# Patient Record
Sex: Female | Born: 1950 | Race: White | Hispanic: No | Marital: Married | Smoking: Never smoker
Health system: Southern US, Community
[De-identification: ages and names within clinical notes are randomized; demographics above are authoritative.]

## PROBLEM LIST (undated history)

## (undated) DIAGNOSIS — M1990 Unspecified osteoarthritis, unspecified site: Secondary | ICD-10-CM

## (undated) HISTORY — PX: APPENDECTOMY: SHX54

---

## 2001-07-02 HISTORY — PX: BREAST CYST ASPIRATION: SHX578

## 2004-07-25 ENCOUNTER — Ambulatory Visit: Payer: Self-pay | Admitting: Family Medicine

## 2005-10-18 ENCOUNTER — Ambulatory Visit: Payer: Self-pay | Admitting: Family Medicine

## 2006-10-24 ENCOUNTER — Ambulatory Visit: Payer: Self-pay | Admitting: Family Medicine

## 2007-12-08 ENCOUNTER — Ambulatory Visit: Payer: Self-pay

## 2008-01-05 ENCOUNTER — Ambulatory Visit: Payer: Self-pay | Admitting: Gastroenterology

## 2009-01-13 ENCOUNTER — Ambulatory Visit: Payer: Self-pay

## 2009-07-02 HISTORY — PX: PARTIAL KNEE ARTHROPLASTY: SHX2174

## 2010-03-09 ENCOUNTER — Ambulatory Visit: Payer: Self-pay | Admitting: Nurse Practitioner

## 2011-03-13 ENCOUNTER — Ambulatory Visit: Payer: Self-pay | Admitting: Nurse Practitioner

## 2012-05-06 ENCOUNTER — Ambulatory Visit: Payer: Self-pay | Admitting: Nurse Practitioner

## 2013-06-16 ENCOUNTER — Ambulatory Visit: Payer: Self-pay | Admitting: Nurse Practitioner

## 2015-07-06 ENCOUNTER — Other Ambulatory Visit: Payer: Self-pay | Admitting: Family Medicine

## 2015-07-06 DIAGNOSIS — Z1239 Encounter for other screening for malignant neoplasm of breast: Secondary | ICD-10-CM

## 2015-07-21 ENCOUNTER — Ambulatory Visit
Admission: RE | Admit: 2015-07-21 | Discharge: 2015-07-21 | Disposition: A | Discharge status: Home / Self Care | Payer: BLUE CROSS/BLUE SHIELD | Source: Ambulatory Visit | Attending: Family Medicine | Admitting: Family Medicine

## 2015-07-21 DIAGNOSIS — Z1239 Encounter for other screening for malignant neoplasm of breast: Secondary | ICD-10-CM

## 2015-07-21 DIAGNOSIS — Z1231 Encounter for screening mammogram for malignant neoplasm of breast: Secondary | ICD-10-CM | POA: Insufficient documentation

## 2016-08-29 ENCOUNTER — Other Ambulatory Visit: Payer: Self-pay | Admitting: Family Medicine

## 2016-08-29 DIAGNOSIS — Z1239 Encounter for other screening for malignant neoplasm of breast: Secondary | ICD-10-CM

## 2016-09-04 ENCOUNTER — Ambulatory Visit
Admission: RE | Admit: 2016-09-04 | Discharge: 2016-09-04 | Disposition: A | Discharge status: Home / Self Care | Payer: BLUE CROSS/BLUE SHIELD | Source: Ambulatory Visit | Attending: Family Medicine | Admitting: Family Medicine

## 2016-09-04 DIAGNOSIS — Z1231 Encounter for screening mammogram for malignant neoplasm of breast: Secondary | ICD-10-CM | POA: Insufficient documentation

## 2016-09-04 DIAGNOSIS — Z1239 Encounter for other screening for malignant neoplasm of breast: Secondary | ICD-10-CM

## 2017-10-11 ENCOUNTER — Other Ambulatory Visit: Payer: Self-pay

## 2017-10-11 ENCOUNTER — Telehealth: Payer: Self-pay

## 2017-10-11 DIAGNOSIS — Z1211 Encounter for screening for malignant neoplasm of colon: Secondary | ICD-10-CM

## 2017-10-11 NOTE — Telephone Encounter (Signed)
Gastroenterology Pre-Procedure Review  Request Date:  Requesting Physician: Dr.   PATIENT REVIEW QUESTIONS: The patient responded to the following health history questions as indicated:    1. Are you having any GI issues? no 2. Do you have a personal history of Polyps? no 3. Do you have a family history of Colon Cancer or Polyps? no 4. Diabetes Mellitus? no 5. Joint replacements in the past 12 months?no 6. Major health problems in the past 3 months?no 7. Any artificial heart valves, MVP, or defibrillator?no    MEDICATIONS & ALLERGIES:    Patient reports the following regarding taking any anticoagulation/antiplatelet therapy:   Plavix, Coumadin, Eliquis, Xarelto, Lovenox, Pradaxa, Brilinta, or Effient? no Aspirin? no  Patient confirms/reports the following medications:  No current outpatient medications on file.   No current facility-administered medications for this visit.     Patient confirms/reports the following allergies:  Allergies not on file  No orders of the defined types were placed in this encounter.   AUTHORIZATION INFORMATION Primary Insurance: 1D#: Group #:  Secondary Insurance: 1D#: Group #:  SCHEDULE INFORMATION: Date:  Time: Location:

## 2017-10-14 ENCOUNTER — Encounter: Payer: Self-pay | Admitting: *Deleted

## 2017-10-14 ENCOUNTER — Other Ambulatory Visit: Payer: Self-pay

## 2017-10-14 ENCOUNTER — Other Ambulatory Visit: Payer: Self-pay | Admitting: Family Medicine

## 2017-10-14 DIAGNOSIS — Z1231 Encounter for screening mammogram for malignant neoplasm of breast: Secondary | ICD-10-CM

## 2017-10-15 ENCOUNTER — Other Ambulatory Visit: Payer: Self-pay

## 2017-10-15 ENCOUNTER — Ambulatory Visit
Admission: RE | Admit: 2017-10-15 | Discharge: 2017-10-15 | Disposition: A | Discharge status: Home / Self Care | Payer: BLUE CROSS/BLUE SHIELD | Source: Ambulatory Visit | Attending: Family Medicine | Admitting: Family Medicine

## 2017-10-15 DIAGNOSIS — Z1231 Encounter for screening mammogram for malignant neoplasm of breast: Secondary | ICD-10-CM | POA: Insufficient documentation

## 2017-10-15 MED ORDER — PEG 3350-KCL-NABCB-NACL-NASULF 236 G PO SOLR: ORAL | 0 refills | Status: AC

## 2017-10-16 NOTE — Discharge Instructions (Signed)
General Anesthesia, Adult, Care After °These instructions provide you with information about caring for yourself after your procedure. Your health care provider may also give you more specific instructions. Your treatment has been planned according to current medical practices, but problems sometimes occur. Call your health care provider if you have any problems or questions after your procedure. °What can I expect after the procedure? °After the procedure, it is common to have: °· Vomiting. °· A sore throat. °· Mental slowness. ° °It is common to feel: °· Nauseous. °· Cold or shivery. °· Sleepy. °· Tired. °· Sore or achy, even in parts of your body where you did not have surgery. ° °Follow these instructions at home: °For at least 24 hours after the procedure: °· Do not: °? Participate in activities where you could fall or become injured. °? Drive. °? Use heavy machinery. °? Drink alcohol. °? Take sleeping pills or medicines that cause drowsiness. °? Make important decisions or sign legal documents. °? Take care of children on your own. °· Rest. °Eating and drinking °· If you vomit, drink water, juice, or soup when you can drink without vomiting. °· Drink enough fluid to keep your urine clear or pale yellow. °· Make sure you have little or no nausea before eating solid foods. °· Follow the diet recommended by your health care provider. °General instructions °· Have a responsible adult stay with you until you are awake and alert. °· Return to your normal activities as told by your health care provider. Ask your health care provider what activities are safe for you. °· Take over-the-counter and prescription medicines only as told by your health care provider. °· If you smoke, do not smoke without supervision. °· Keep all follow-up visits as told by your health care provider. This is important. °Contact a health care provider if: °· You continue to have nausea or vomiting at home, and medicines are not helpful. °· You  cannot drink fluids or start eating again. °· You cannot urinate after 8-12 hours. °· You develop a skin rash. °· You have fever. °· You have increasing redness at the site of your procedure. °Get help right away if: °· You have difficulty breathing. °· You have chest pain. °· You have unexpected bleeding. °· You feel that you are having a life-threatening or urgent problem. °This information is not intended to replace advice given to you by your health care provider. Make sure you discuss any questions you have with your health care provider. °Document Released: 09/24/2000 Document Revised: 11/21/2015 Document Reviewed: 06/02/2015 °Elsevier Interactive Patient Education © 2018 [employer] ° °

## 2017-10-17 ENCOUNTER — Ambulatory Visit: Payer: BLUE CROSS/BLUE SHIELD | Admitting: Anesthesiology

## 2017-10-17 ENCOUNTER — Ambulatory Visit
Admission: RE | Admit: 2017-10-17 | Discharge: 2017-10-17 | Disposition: A | Discharge status: Home / Self Care | Payer: BLUE CROSS/BLUE SHIELD | Source: Ambulatory Visit | Attending: Gastroenterology | Admitting: Gastroenterology

## 2017-10-17 ENCOUNTER — Encounter
Admission: RE | Disposition: A | Discharge status: Home / Self Care | Payer: Self-pay | Source: Ambulatory Visit | Attending: Gastroenterology

## 2017-10-17 DIAGNOSIS — Z1211 Encounter for screening for malignant neoplasm of colon: Secondary | ICD-10-CM | POA: Diagnosis not present

## 2017-10-17 DIAGNOSIS — Z96652 Presence of left artificial knee joint: Secondary | ICD-10-CM | POA: Diagnosis not present

## 2017-10-17 DIAGNOSIS — Z79899 Other long term (current) drug therapy: Secondary | ICD-10-CM | POA: Diagnosis not present

## 2017-10-17 DIAGNOSIS — D123 Benign neoplasm of transverse colon: Secondary | ICD-10-CM | POA: Diagnosis not present

## 2017-10-17 DIAGNOSIS — K640 First degree hemorrhoids: Secondary | ICD-10-CM | POA: Insufficient documentation

## 2017-10-17 HISTORY — PX: POLYPECTOMY: SHX5525

## 2017-10-17 HISTORY — DX: Unspecified osteoarthritis, unspecified site: M19.90

## 2017-10-17 HISTORY — PX: COLONOSCOPY WITH PROPOFOL: SHX5780

## 2017-10-17 SURGERY — COLONOSCOPY WITH PROPOFOL
Anesthesia: General | Wound class: Contaminated

## 2017-10-17 MED ORDER — LACTATED RINGERS IV SOLN
INTRAVENOUS | Status: DC
  Administered 2017-10-17: 08:00:00 via INTRAVENOUS

## 2017-10-17 MED ORDER — LIDOCAINE HCL (CARDIAC) PF 100 MG/5ML IV SOSY
PREFILLED_SYRINGE | INTRAVENOUS | Status: DC | PRN
  Administered 2017-10-17: 30 mg via INTRAVENOUS

## 2017-10-17 MED ORDER — ACETAMINOPHEN 160 MG/5ML PO SOLN: 325.0000 mg | ORAL | Status: DC | PRN

## 2017-10-17 MED ORDER — LACTATED RINGERS IV SOLN: INTRAVENOUS | Status: DC

## 2017-10-17 MED ORDER — ONDANSETRON HCL 4 MG/2ML IJ SOLN: 4.0000 mg | Freq: Once | INTRAMUSCULAR | Status: DC | PRN

## 2017-10-17 MED ORDER — ACETAMINOPHEN 325 MG PO TABS: 650.0000 mg | ORAL_TABLET | Freq: Once | ORAL | Status: DC | PRN

## 2017-10-17 MED ORDER — PROPOFOL 10 MG/ML IV BOLUS
INTRAVENOUS | Status: DC | PRN
  Administered 2017-10-17: 30 mg via INTRAVENOUS
  Administered 2017-10-17: 40 mg via INTRAVENOUS
  Administered 2017-10-17: 30 mg via INTRAVENOUS
  Administered 2017-10-17: 150 mg via INTRAVENOUS
  Administered 2017-10-17: 30 mg via INTRAVENOUS

## 2017-10-17 SURGICAL SUPPLY — 24 items
CANISTER SUCT 1200ML W/VALVE (MISCELLANEOUS) ×4 IMPLANT
CLIP HMST 235XBRD CATH ROT (MISCELLANEOUS) IMPLANT
CLIP RESOLUTION 360 11X235 (MISCELLANEOUS)
ELECT REM PT RETURN 9FT ADLT (ELECTROSURGICAL)
ELECTRODE REM PT RTRN 9FT ADLT (ELECTROSURGICAL) IMPLANT
FCP ESCP3.2XJMB 240X2.8X (MISCELLANEOUS)
FORCEPS BIOP RAD 4 LRG CAP 4 (CUTTING FORCEPS) ×4 IMPLANT
FORCEPS BIOP RJ4 240 W/NDL (MISCELLANEOUS)
FORCEPS ESCP3.2XJMB 240X2.8X (MISCELLANEOUS) IMPLANT
GOWN CVR UNV OPN BCK APRN NK (MISCELLANEOUS) ×4 IMPLANT
GOWN ISOL THUMB LOOP REG UNIV (MISCELLANEOUS) ×4
INJECTOR VARIJECT VIN23 (MISCELLANEOUS) IMPLANT
KIT DEFENDO VALVE AND CONN (KITS) IMPLANT
KIT ENDO PROCEDURE OLY (KITS) ×4 IMPLANT
MARKER SPOT ENDO TATTOO 5ML (MISCELLANEOUS) IMPLANT
PROBE APC STR FIRE (PROBE) IMPLANT
RETRIEVER NET ROTH 2.5X230 LF (MISCELLANEOUS) IMPLANT
SNARE SHORT THROW 13M SML OVAL (MISCELLANEOUS) IMPLANT
SNARE SHORT THROW 30M LRG OVAL (MISCELLANEOUS) IMPLANT
SNARE SNG USE RND 15MM (INSTRUMENTS) IMPLANT
SPOT EX ENDOSCOPIC TATTOO (MISCELLANEOUS)
TRAP ETRAP POLY (MISCELLANEOUS) IMPLANT
VARIJECT INJECTOR VIN23 (MISCELLANEOUS)
WATER STERILE IRR 250ML POUR (IV SOLUTION) ×4 IMPLANT

## 2017-10-17 NOTE — H&P (Signed)
Vicki Lame, MD [address] [place], [place] [address] Phone: [phone] Fax : [phone]  Primary Care Physician:  Verlin Dike, MD Primary Gastroenterologist:  Dr. Allen Norris  Pre-Procedure History & Physical: HPI:  Vicki Ferguson is a 67 y.o. female is here for a screening colonoscopy.   Past Medical History:  Diagnosis Date  . Arthritis    right knee    Past Surgical History:  Procedure Laterality Date  . APPENDECTOMY    . BREAST CYST ASPIRATION Left 2003   neg  . PARTIAL KNEE ARTHROPLASTY Left 2011    Prior to Admission medications   Medication Sig Start Date End Date Taking? Authorizing Provider  Calcium Carb-Cholecalciferol (CALCIUM 600 + D PO) Take by mouth daily.   Yes [provider]  polyethylene glycol (GOLYTELY) 236 g solution Drink one 8 oz glass every 20 mins until entire container is done starting at 5:00pm on 10/16/17 10/15/17  Yes Vicki Lame, MD    Allergies as of 10/11/2017  . (Not on File)    Family History  Problem Relation Age of Onset  . Breast cancer Neg Hx     Social History   Socioeconomic History  . Marital status: Married    Spouse name: Not on file  . Number of children: Not on file  . Years of education: Not on file  . Highest education level: Not on file  Occupational History  . Not on file  Social Needs  . Financial resource strain: Not on file  . Food insecurity:    Worry: Not on file    Inability: Not on file  . Transportation needs:    Medical: Not on file    Non-medical: Not on file  Tobacco Use  . Smoking status: Never Smoker  . Smokeless tobacco: Never Used  Substance and Sexual Activity  . Alcohol use: Never    Frequency: Never  . Drug use: Not on file  . Sexual activity: Not on file  Lifestyle  . Physical activity:    Days per week: Not on file    Minutes per session: Not on file  . Stress: Not on file  Relationships  . Social connections:    Talks on phone: Not on file      Gets together: Not on file    Attends religious service: Not on file    Active member of club or organization: Not on file    Attends meetings of clubs or organizations: Not on file    Relationship status: Not on file  . Intimate partner violence:    Fear of current or ex partner: Not on file    Emotionally abused: Not on file    Physically abused: Not on file    Forced sexual activity: Not on file  Other Topics Concern  . Not on file  Social History Narrative  . Not on file    Review of Systems: See HPI, otherwise negative ROS  Physical Exam: BP (!) 142/79   Pulse 96   Temp 97.9 F (36.6 C) (Temporal)   Resp 16   Ht 5\' 3"  (1.6 m)   Wt 150 lb (68 kg)   SpO2 97%   BMI 26.57 kg/m  General:   Alert,  pleasant and cooperative in NAD Head:  Normocephalic and atraumatic. Neck:  Supple; no masses or thyromegaly. Lungs:  Clear throughout to auscultation.    Heart:  Regular rate and rhythm. Abdomen:  Soft, nontender and nondistended. Normal  bowel sounds, without guarding, and without rebound.   Neurologic:  Alert and  oriented x4;  grossly normal neurologically.  Impression/Plan: [name] is now here to undergo a screening colonoscopy.  Risks, benefits, and alternatives regarding colonoscopy have been reviewed with the patient.  Questions have been answered.  All parties agreeable.

## 2017-10-17 NOTE — Transfer of Care (Signed)
Immediate Anesthesia Transfer of Care Note  Patient: Vicki Ferguson  Procedure(s) Performed: COLONOSCOPY WITH PROPOFOL (N/A ) POLYPECTOMY  Patient Location: PACU  Anesthesia Type: General  Level of Consciousness: awake, alert  and patient cooperative  Airway and Oxygen Therapy: Patient Spontanous Breathing and Patient connected to supplemental oxygen  Post-op Assessment: Post-op Vital signs reviewed, Patient's Cardiovascular Status Stable, Respiratory Function Stable, Patent Airway and No signs of Nausea or vomiting  Post-op Vital Signs: Reviewed and stable  Complications: No apparent anesthesia complications

## 2017-10-17 NOTE — Anesthesia Postprocedure Evaluation (Signed)
Anesthesia Post Note  Patient: Vicki Ferguson  Procedure(s) Performed: COLONOSCOPY WITH PROPOFOL (N/A ) POLYPECTOMY  Patient location during evaluation: PACU Anesthesia Type: General Level of consciousness: awake and alert, oriented and patient cooperative Pain management: pain level controlled Vital Signs Assessment: post-procedure vital signs reviewed and stable Respiratory status: spontaneous breathing, nonlabored ventilation and respiratory function stable Cardiovascular status: blood pressure returned to baseline and stable Postop Assessment: adequate PO intake Anesthetic complications: no    Darrin Nipper

## 2017-10-17 NOTE — Anesthesia Preprocedure Evaluation (Signed)
Anesthesia Evaluation  Patient identified by MRN, date of birth, ID band Patient awake    Reviewed: Allergy & Precautions, NPO status , Patient's Chart, lab work & pertinent test results  History of Anesthesia Complications Negative for: history of anesthetic complications  Airway Mallampati: I  TM Distance: >3 FB Neck ROM: Full    Dental no notable dental hx.    Pulmonary  Snoring    Pulmonary exam normal breath sounds clear to auscultation       Cardiovascular Exercise Tolerance: Good negative cardio ROS Normal cardiovascular exam Rhythm:Regular Rate:Normal     Neuro/Psych negative neurological ROS     GI/Hepatic negative GI ROS,   Endo/Other  negative endocrine ROS  Renal/GU negative Renal ROS     Musculoskeletal  (+) Arthritis , Osteoarthritis,    Abdominal   Peds  Hematology negative hematology ROS (+)   Anesthesia Other Findings   Reproductive/Obstetrics                             Anesthesia Physical Anesthesia Plan  ASA: I  Anesthesia Plan: General   Post-op Pain Management:    Induction: Intravenous  PONV Risk Score and Plan: 2 and Propofol infusion and TIVA  Airway Management Planned: Natural Airway  Additional Equipment:   Intra-op Plan:   Post-operative Plan:   Informed Consent: I have reviewed the patients History and Physical, chart, labs and discussed the procedure including the risks, benefits and alternatives for the proposed anesthesia with the patient or authorized representative who has indicated his/her understanding and acceptance.     Plan Discussed with: CRNA  Anesthesia Plan Comments:         Anesthesia Quick Evaluation

## 2017-10-17 NOTE — Op Note (Signed)
[facility] Patient Name: Vicki Ferguson Procedure Date: 10/17/2017 8:34 AM MRN: [id number] Account #: [id number] Date of Birth: 18-Nov-1950 Admit Type: Outpatient Age: 67 Room: [facility] OR ROOM 01 Gender: Female Note Status: Finalized Procedure:            Colonoscopy Indications:          Screening for colorectal malignant neoplasm Providers:            [name] MD, MD Medicines:            Propofol per Anesthesia Complications:        No immediate complications. Procedure:            Pre-Anesthesia Assessment:                       - Prior to the procedure, a History and Physical was                        performed, and patient medications and allergies were                        reviewed. The patient's tolerance of previous                        anesthesia was also reviewed. The risks and benefits of                        the procedure and the sedation options and risks were                        discussed with the patient. All questions were                        answered, and informed consent was obtained. Prior                        Anticoagulants: The patient has taken no previous                        anticoagulant or antiplatelet agents. ASA Grade                        Assessment: II - A patient with mild systemic disease.                        After reviewing the risks and benefits, the patient was                        deemed in satisfactory condition to undergo the                        procedure.                       After obtaining informed consent, the colonoscope was                        passed under direct vision. Throughout the procedure,                        the patient's blood pressure,  pulse, and oxygen                        saturations were monitored continuously. The [place] [id number]) was introduced through the                        anus and advanced to the the  cecum, identified by                        appendiceal orifice and ileocecal valve. The                        colonoscopy was performed without difficulty. The                        patient tolerated the procedure well. The quality of                        the bowel preparation was excellent. Findings:      The perianal and digital rectal examinations were normal.      A 3 mm polyp was found in the transverse colon. The polyp was sessile.       The polyp was removed with a cold biopsy forceps. Resection and       retrieval were complete.      Internal hemorrhoids were found during retroflexion. The hemorrhoids       were Grade I (internal hemorrhoids that do not prolapse). Impression:           - One 3 mm polyp in the transverse colon, removed with                        a cold biopsy forceps. Resected and retrieved.                       - Internal hemorrhoids. Recommendation:       - Discharge patient to home.                       - Resume previous diet.                       - Continue present medications. Procedure Code(s):    --- Professional ---                       [id number], Colonoscopy, flexible; with biopsy, single or                        multiple Diagnosis Code(s):    --- Professional ---                       Z12.11, Encounter for screening for malignant neoplasm                        of colon                       D12.3, Benign neoplasm of transverse colon (hepatic  flexure or splenic flexure) CPT copyright 2017 [employer]. All rights reserved. The codes documented in this report are preliminary and upon coder review may  be revised to meet current compliance requirements. [name] MD, MD 10/17/2017 9:01:12 AM This report has been signed electronically. Number of Addenda: 0 Note Initiated On: 10/17/2017 8:34 AM Scope Withdrawal Time: 0 hours 7 minutes 32 seconds  Total Procedure Duration: 0 hours 15 minutes 58 seconds        [facility]

## 2017-10-18 ENCOUNTER — Encounter: Payer: Self-pay | Admitting: Gastroenterology

## 2017-10-21 ENCOUNTER — Encounter: Payer: Self-pay | Admitting: Gastroenterology

## 2018-12-01 ENCOUNTER — Other Ambulatory Visit: Payer: Self-pay | Admitting: Family Medicine

## 2018-12-01 DIAGNOSIS — Z1231 Encounter for screening mammogram for malignant neoplasm of breast: Secondary | ICD-10-CM

## 2019-01-08 ENCOUNTER — Other Ambulatory Visit: Payer: Self-pay

## 2019-01-08 ENCOUNTER — Encounter (INDEPENDENT_AMBULATORY_CARE_PROVIDER_SITE_OTHER): Payer: Self-pay

## 2019-01-08 ENCOUNTER — Ambulatory Visit
Admission: RE | Admit: 2019-01-08 | Discharge: 2019-01-08 | Disposition: A | Discharge status: Home / Self Care | Payer: Medicare Other | Source: Ambulatory Visit | Attending: Family Medicine | Admitting: Family Medicine

## 2019-01-08 DIAGNOSIS — Z1231 Encounter for screening mammogram for malignant neoplasm of breast: Secondary | ICD-10-CM | POA: Diagnosis not present

## 2020-05-23 ENCOUNTER — Other Ambulatory Visit: Payer: Self-pay | Admitting: Student

## 2020-05-23 DIAGNOSIS — Z1231 Encounter for screening mammogram for malignant neoplasm of breast: Secondary | ICD-10-CM

## 2020-06-08 ENCOUNTER — Ambulatory Visit
Admission: RE | Admit: 2020-06-08 | Discharge: 2020-06-08 | Disposition: A | Discharge status: Home / Self Care | Payer: Medicare Other | Source: Ambulatory Visit | Attending: Obstetrics and Gynecology | Admitting: Obstetrics and Gynecology

## 2020-06-08 ENCOUNTER — Other Ambulatory Visit: Payer: Self-pay

## 2020-06-08 DIAGNOSIS — Z1231 Encounter for screening mammogram for malignant neoplasm of breast: Secondary | ICD-10-CM | POA: Insufficient documentation

## 2020-06-17 ENCOUNTER — Other Ambulatory Visit: Payer: Self-pay | Admitting: Student

## 2020-06-17 DIAGNOSIS — Z1231 Encounter for screening mammogram for malignant neoplasm of breast: Secondary | ICD-10-CM

## 2020-06-17 DIAGNOSIS — Z780 Asymptomatic menopausal state: Secondary | ICD-10-CM

## 2020-06-22 ENCOUNTER — Other Ambulatory Visit: Payer: Self-pay

## 2020-06-22 ENCOUNTER — Ambulatory Visit
Admission: RE | Admit: 2020-06-22 | Discharge: 2020-06-22 | Disposition: A | Discharge status: Home / Self Care | Payer: Medicare Other | Source: Ambulatory Visit | Attending: Obstetrics and Gynecology | Admitting: Obstetrics and Gynecology

## 2020-06-22 DIAGNOSIS — Z780 Asymptomatic menopausal state: Secondary | ICD-10-CM | POA: Insufficient documentation

## 2021-06-16 ENCOUNTER — Other Ambulatory Visit: Payer: Self-pay | Admitting: Family Medicine

## 2021-06-16 DIAGNOSIS — Z1231 Encounter for screening mammogram for malignant neoplasm of breast: Secondary | ICD-10-CM

## 2021-06-29 ENCOUNTER — Other Ambulatory Visit: Payer: Self-pay

## 2021-06-29 ENCOUNTER — Ambulatory Visit
Admission: RE | Admit: 2021-06-29 | Discharge: 2021-06-29 | Disposition: A | Discharge status: Home / Self Care | Payer: Medicare Other | Source: Ambulatory Visit | Attending: Family Medicine | Admitting: Family Medicine

## 2021-06-29 DIAGNOSIS — Z1231 Encounter for screening mammogram for malignant neoplasm of breast: Secondary | ICD-10-CM | POA: Diagnosis present

## 2022-04-08 IMAGING — MG MM DIGITAL SCREENING BILAT W/ TOMO AND CAD
8 series · 8 of 24 positions shown · non-contrast
Comparison: Previous exam(s).

CLINICAL DATA: Screening.

EXAM:
DIGITAL SCREENING BILATERAL MAMMOGRAM WITH TOMOSYNTHESIS AND CAD
TECHNIQUE: Bilateral screening digital craniocaudal and mediolateral oblique
mammograms were obtained. Bilateral screening digital breast
tomosynthesis was performed. The images were evaluated with
computer-aided detection.

[L CC synth-2D]
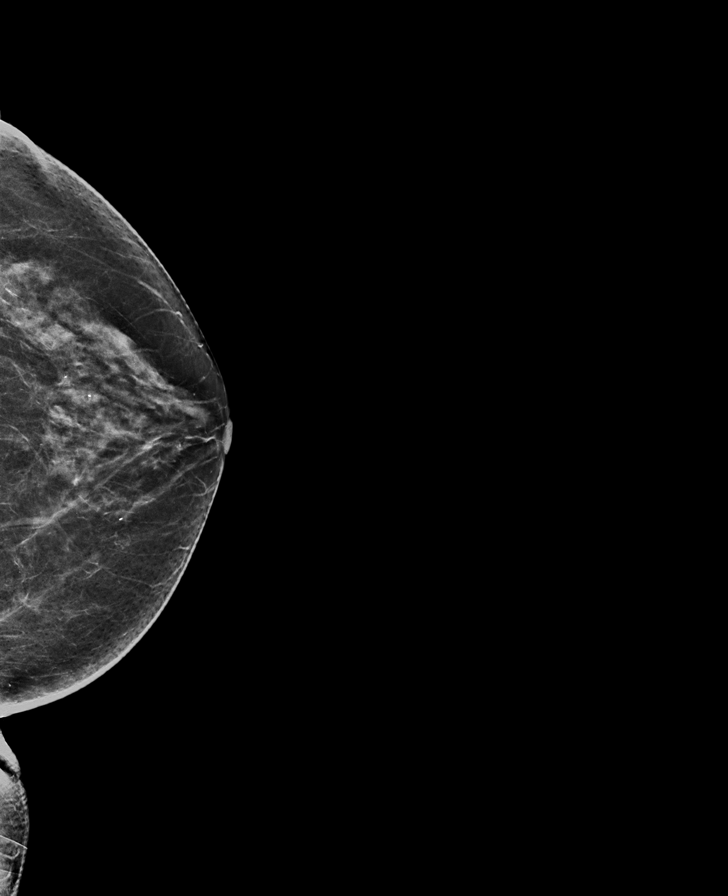

[R MLO synth-2D]
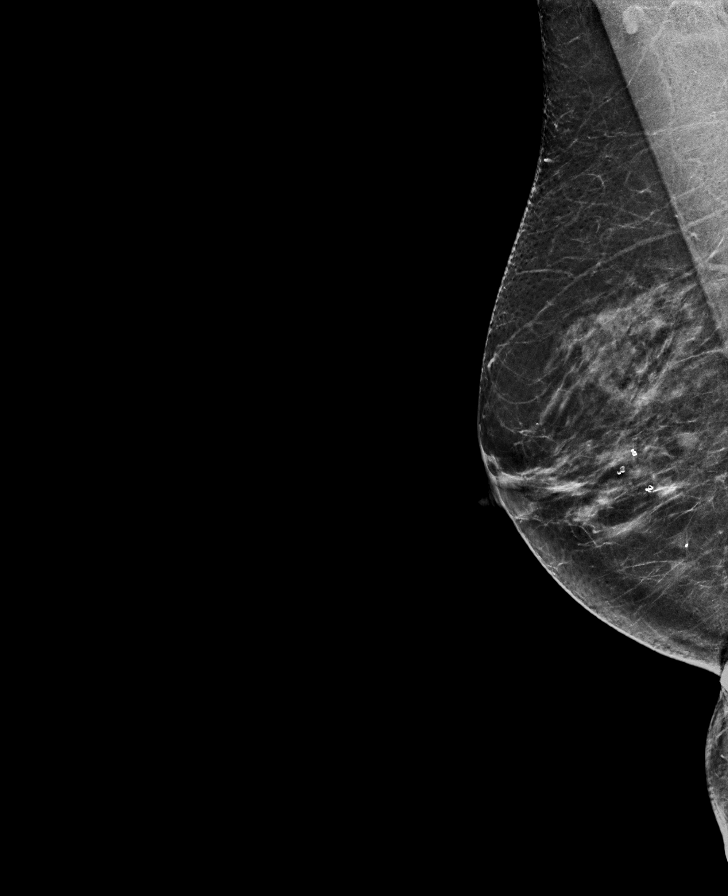

[R CC synth-2D]
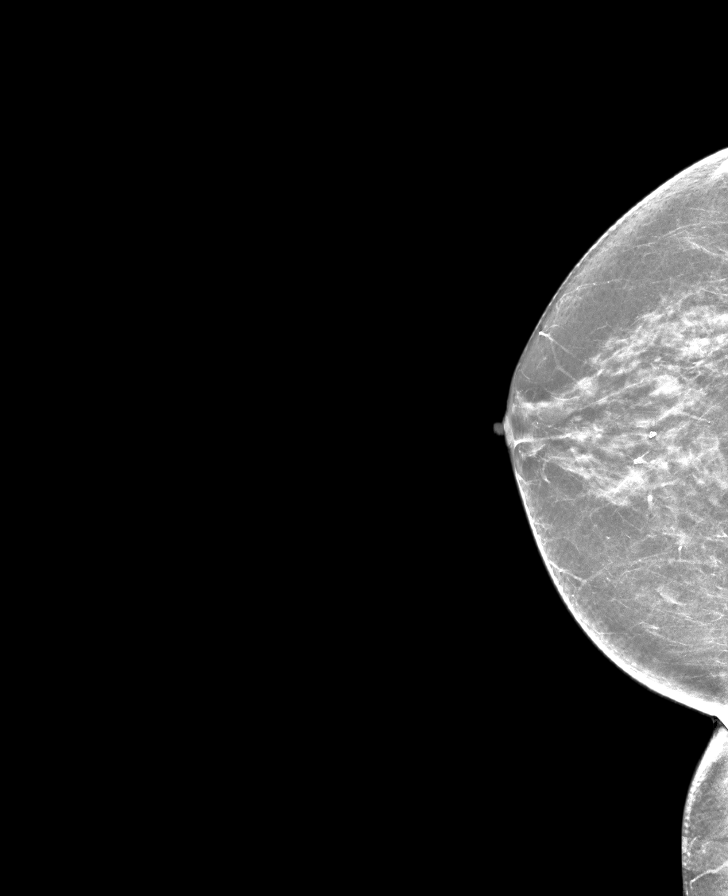

[L MLO synth-2D]
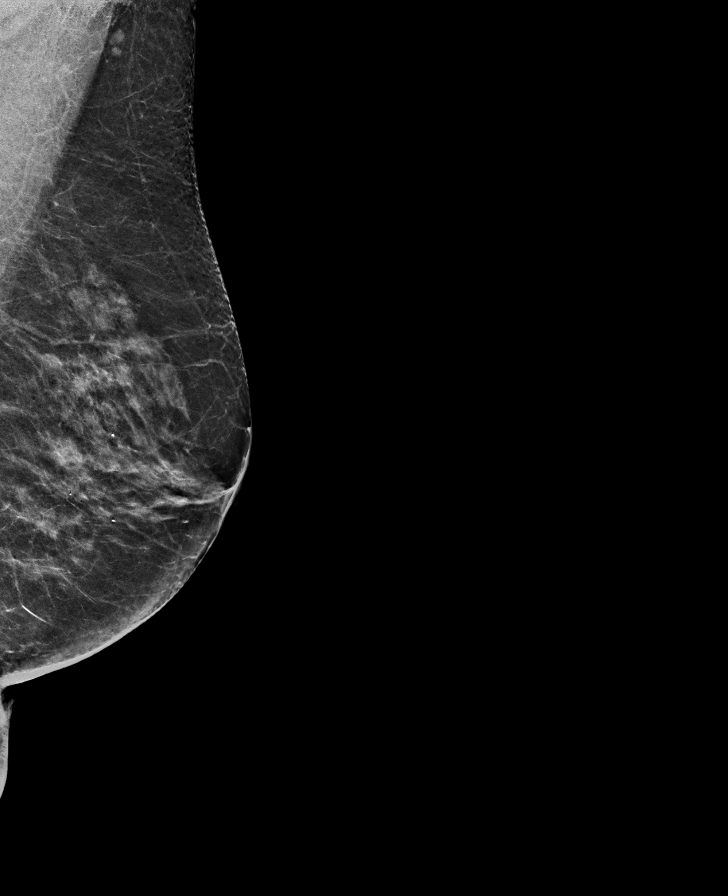

[R MLO tomo · tomo slice 33/65.0]
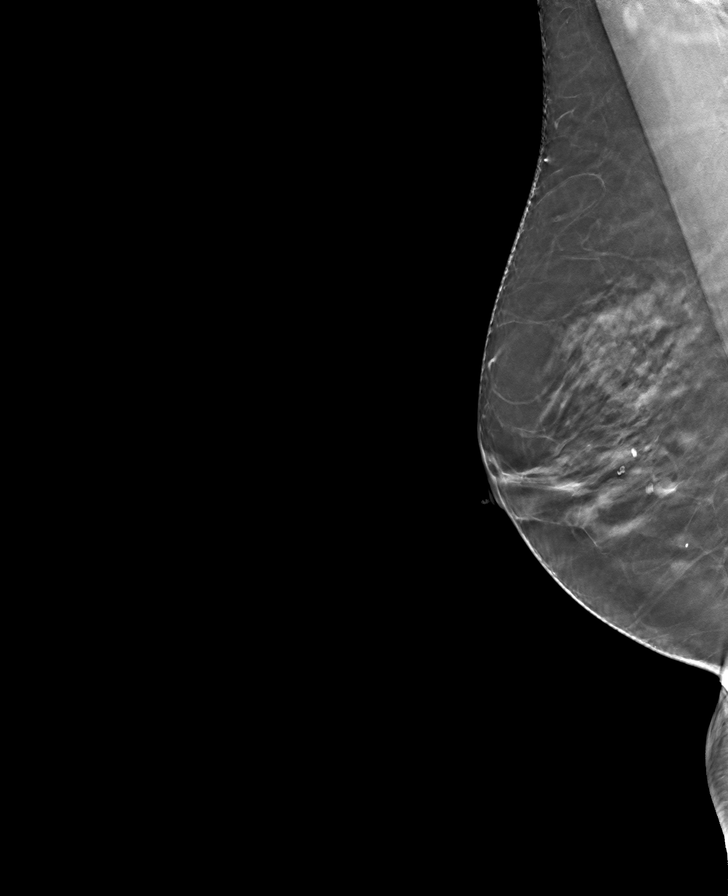

[R CC tomo · tomo slice 33/64.0]
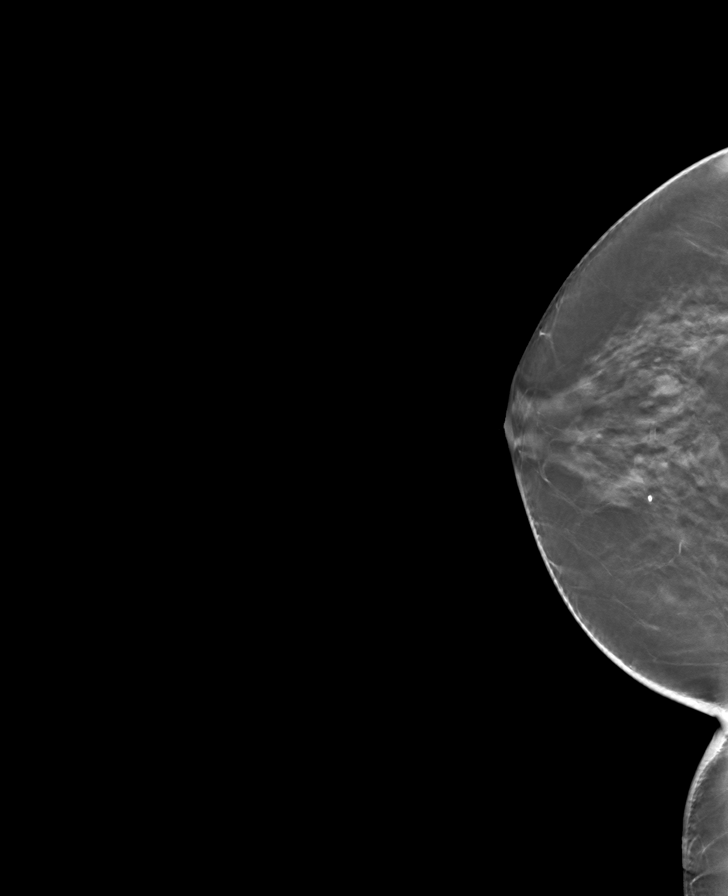

[L MLO tomo · tomo slice 31/62.0]
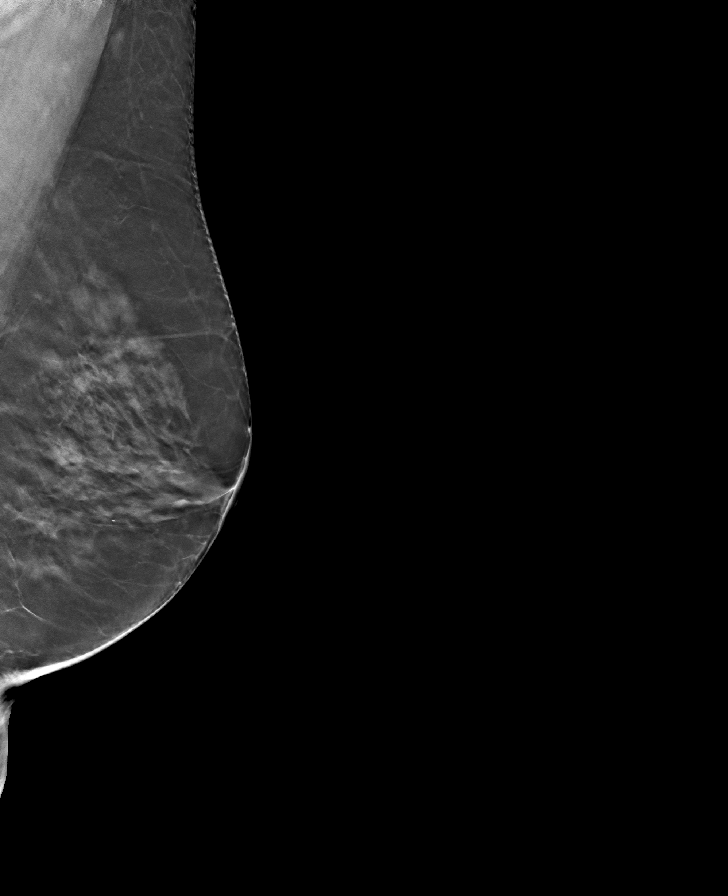

[L CC tomo · tomo slice 33/64.0]
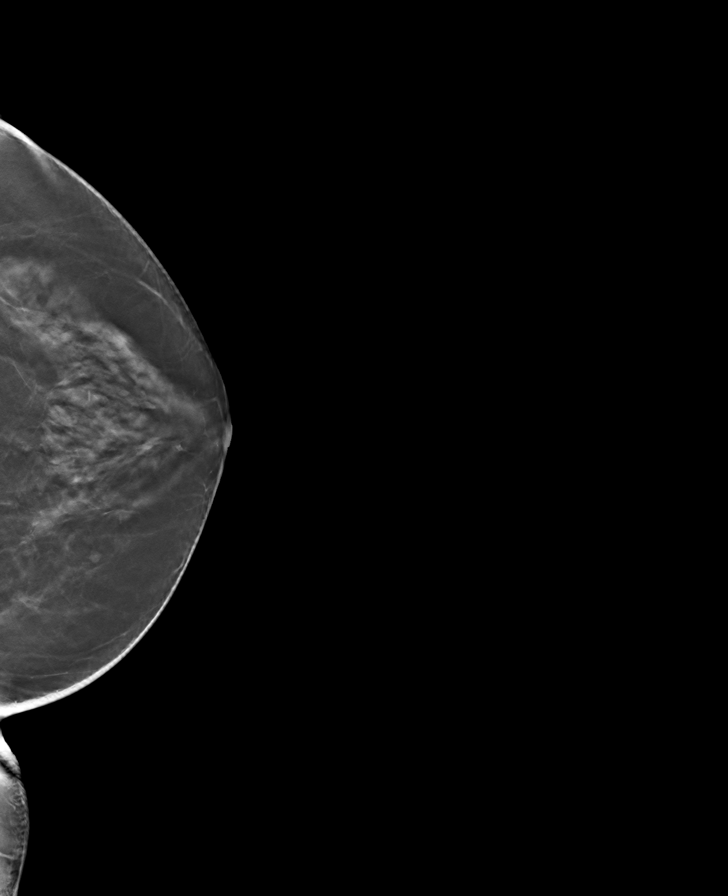

[8 of 24 positions shown; findings below may reference images not displayed]

ACR Breast Density Category c: The breast tissue is heterogeneously
dense, which may obscure small masses.
FINDINGS: There are no findings suspicious for malignancy.
IMPRESSION: No mammographic evidence of malignancy. A result letter of this
screening mammogram will be mailed directly to the patient.

RECOMMENDATION:
Screening mammogram in one year. (Code:Q3-W-BC3)

BI-RADS CATEGORY  1: Negative.

## 2022-07-10 ENCOUNTER — Other Ambulatory Visit: Payer: Self-pay | Admitting: Student

## 2022-07-10 DIAGNOSIS — Z780 Asymptomatic menopausal state: Secondary | ICD-10-CM

## 2022-07-13 ENCOUNTER — Other Ambulatory Visit: Payer: Self-pay | Admitting: Student

## 2022-07-13 DIAGNOSIS — Z1231 Encounter for screening mammogram for malignant neoplasm of breast: Secondary | ICD-10-CM

## 2022-07-23 ENCOUNTER — Ambulatory Visit
Admission: RE | Admit: 2022-07-23 | Discharge: 2022-07-23 | Disposition: A | Discharge status: Home / Self Care | Payer: Medicare Other | Source: Ambulatory Visit | Attending: Student | Admitting: Student

## 2022-07-23 DIAGNOSIS — Z780 Asymptomatic menopausal state: Secondary | ICD-10-CM

## 2022-07-23 DIAGNOSIS — Z1231 Encounter for screening mammogram for malignant neoplasm of breast: Secondary | ICD-10-CM | POA: Diagnosis not present

## 2023-09-09 ENCOUNTER — Other Ambulatory Visit: Payer: Self-pay | Admitting: Family Medicine

## 2023-09-09 DIAGNOSIS — Z1231 Encounter for screening mammogram for malignant neoplasm of breast: Secondary | ICD-10-CM

## 2023-09-17 ENCOUNTER — Ambulatory Visit
Admission: RE | Admit: 2023-09-17 | Discharge: 2023-09-17 | Disposition: A | Discharge status: Home / Self Care | Source: Ambulatory Visit | Attending: Family Medicine | Admitting: Family Medicine

## 2023-09-17 DIAGNOSIS — Z1231 Encounter for screening mammogram for malignant neoplasm of breast: Secondary | ICD-10-CM | POA: Diagnosis present
